# Patient Record
Sex: Male | Born: 2005 | Race: White | Hispanic: No | Marital: Single | Smoking: Never smoker
Health system: Southern US, Community
[De-identification: ages and names within clinical notes are randomized; demographics above are authoritative.]

## PROBLEM LIST (undated history)

## (undated) DIAGNOSIS — D169 Benign neoplasm of bone and articular cartilage, unspecified: Secondary | ICD-10-CM

## (undated) HISTORY — DX: Benign neoplasm of bone and articular cartilage, unspecified: D16.9

## (undated) HISTORY — PX: OTHER SURGICAL HISTORY: SHX169

---
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
## 2024-01-04 ENCOUNTER — Ambulatory Visit: Admitting: Family Medicine

## 2024-01-04 ENCOUNTER — Encounter: Payer: Self-pay | Admitting: Family Medicine

## 2024-01-04 VITALS — BP 110/75 | HR 90 | Temp 98.1°F | Ht 75.0 in | Wt 201.0 lb

## 2024-01-04 DIAGNOSIS — F900 Attention-deficit hyperactivity disorder, predominantly inattentive type: Secondary | ICD-10-CM | POA: Diagnosis not present

## 2024-01-04 MED ORDER — AMPHETAMINE-DEXTROAMPHET ER 15 MG PO CP24
15.0000 mg | ORAL_CAPSULE | ORAL | 0 refills | Status: DC
Start: 2024-03-03 — End: 2024-04-15

## 2024-01-04 MED ORDER — AMPHETAMINE-DEXTROAMPHET ER 15 MG PO CP24: 15.0000 mg | ORAL_CAPSULE | ORAL | 0 refills | Status: DC

## 2024-01-04 NOTE — Progress Notes (Signed)
 BP 110/75   Pulse 90   Temp 98.1 F (36.7 C)   Ht 6' 3 (1.905 m)   Wt 201 lb (91.2 kg)   SpO2 96%   BMI 25.12 kg/m    Subjective:   Patient ID: Brian Cochran, male    DOB: 2006-05-20, 18 y.o.   MRN: [id number]  HPI: Brian Cochran is a 18 y.o. male presenting on 01/04/2024 for Medical Management of Chronic Issues (Patient states that he thinks he is on the right medicine. He believes 20mg  is better than the 10mg . Patient states he is sleeping better but would like to talk about the medication y'all talked about last time that may help improve sleep. )   HPI ADHD recheck Current rx-Adderall 10 mg daily, will increase to 15 mg # meds rx-30/month Effectiveness of current meds-works okay, the 20s worked better for cognition but had trouble with sleep and appetite but the tendons do not work completely well. Adverse reactions form meds-none on the lower dose Pill count performed-No Last drug screen -N/A ( high risk q66m, moderate risk q51m, low risk yearly ) Urine drug screen today- No Was the NCCSR reviewed-yes  If yes were their any concerning findings? -None Controlled substance contract signed on:    Relevant past medical, surgical, family and social history reviewed and updated as indicated. Interim medical history since our last visit reviewed. Allergies and medications reviewed and updated.  Review of Systems  Constitutional:  Negative for chills and fever.  Eyes:  Negative for visual disturbance.  Respiratory:  Negative for shortness of breath and wheezing.   Cardiovascular:  Negative for chest pain and leg swelling.  Skin:  Negative for rash.  Psychiatric/Behavioral:  Positive for decreased concentration.   All other systems reviewed and are negative.   Per HPI unless specifically indicated above   Allergies as of 01/04/2024   No Known Allergies      Medication List        Accurate as of January 04, 2024  2:50 PM. If you have any questions, ask your nurse or  doctor.          STOP taking these medications    amphetamine -dextroamphetamine 10 MG 24 hr capsule Commonly known as: Adderall XR Replaced by: amphetamine -dextroamphetamine 15 MG 24 hr capsule Stopped by: Lucio Sabin Annalaya Wile       TAKE these medications    amphetamine -dextroamphetamine 15 MG 24 hr capsule Commonly known as: Adderall XR Take 1 capsule by mouth every morning. Start taking on: February 02, 2024 Replaces: amphetamine -dextroamphetamine 10 MG 24 hr capsule Started by: Lucio Sabin Ellamae Lybeck   amphetamine -dextroamphetamine 15 MG 24 hr capsule Commonly known as: Adderall XR Take 1 capsule by mouth every morning. Start taking on: March 03, 2024 Started by: Melodie Spry A Tilak Oakley   famotidine  20 MG tablet Commonly known as: PEPCID  TAKE 1 TABLET (20 MG TOTAL) BY MOUTH 2 (TWO) TIMES DAILY. DX. K21.9         Objective:   BP 110/75   Pulse 90   Temp 98.1 F (36.7 C)   Ht 6' 3 (1.905 m)   Wt 201 lb (91.2 kg)   SpO2 96%   BMI 25.12 kg/m   Wt Readings from Last 3 Encounters:  01/04/24 201 lb (91.2 kg) (93%, Z= 1.48)*  12/09/23 206 lb (93.4 kg) (95%, Z= 1.60)*  11/11/23 207 lb (93.9 kg) (95%, Z= 1.63)*   * Growth percentiles are based on CDC (Boys, 2-20 Years) data.  Physical Exam Vitals and nursing note reviewed.  Constitutional:      General: He is not in acute distress.    Appearance: He is well-developed. He is not diaphoretic.   Eyes:     General: No scleral icterus.    Conjunctiva/sclera: Conjunctivae normal.   Neck:     Thyroid: No thyromegaly.   Cardiovascular:     Rate and Rhythm: Normal rate and regular rhythm.     Heart sounds: Normal heart sounds. No murmur heard. Pulmonary:     Effort: Pulmonary effort is normal. No respiratory distress.     Breath sounds: Normal breath sounds. No wheezing.   Musculoskeletal:        General: Normal range of motion.     Cervical back: Neck supple.  Lymphadenopathy:     Cervical: No cervical  adenopathy.   Skin:    General: Skin is warm and dry.     Findings: No rash.   Neurological:     Mental Status: He is alert and oriented to person, place, and time.     Coordination: Coordination normal.   Psychiatric:        Attention and Perception: He is inattentive.        Behavior: Behavior normal.       Assessment & Plan:   Problem List Items Addressed This Visit       Other   Attention deficit hyperactivity disorder (ADHD), predominantly inattentive type - Primary   Relevant Medications   amphetamine -dextroamphetamine (ADDERALL XR) 15 MG 24 hr capsule (Start on 02/02/2024)   amphetamine -dextroamphetamine (ADDERALL XR) 15 MG 24 hr capsule (Start on 03/03/2024)    Increase to 15 mg daily and see if it does better without as many side effects. Follow up plan: Return in about 3 months (around 04/05/2024), or if symptoms worsen or fail to improve, for ADHD recheck.  Counseling provided for all of the vaccine components No orders of the defined types were placed in this encounter.   Jolyne Needs, MD [facility] 01/04/2024, 2:50 PM

## 2024-02-12 ENCOUNTER — Other Ambulatory Visit: Payer: Self-pay | Admitting: Family Medicine

## 2024-02-12 DIAGNOSIS — K219 Gastro-esophageal reflux disease without esophagitis: Secondary | ICD-10-CM

## 2024-04-07 ENCOUNTER — Encounter: Payer: Self-pay | Admitting: Family Medicine

## 2024-04-07 ENCOUNTER — Ambulatory Visit: Admitting: Family Medicine

## 2024-04-15 ENCOUNTER — Ambulatory Visit: Admitting: Family Medicine

## 2024-04-15 ENCOUNTER — Encounter: Payer: Self-pay | Admitting: Family Medicine

## 2024-04-15 VITALS — BP 127/78 | HR 86 | Ht 75.0 in

## 2024-04-15 DIAGNOSIS — F900 Attention-deficit hyperactivity disorder, predominantly inattentive type: Secondary | ICD-10-CM

## 2024-04-15 MED ORDER — AMPHETAMINE-DEXTROAMPHET ER 15 MG PO CP24: 15.0000 mg | ORAL_CAPSULE | ORAL | 0 refills | Status: DC

## 2024-04-15 NOTE — Progress Notes (Signed)
 BP 127/78   Pulse 86   Ht 6' 3 (1.905 m)   SpO2 98%   BMI 25.12 kg/m    Subjective:   Patient ID: Brian Cochran, male    DOB: Oct 11, 2005, 18 y.o.   MRN: [id number]  HPI: Brian Cochran is a 18 y.o. male presenting on 04/15/2024 for Medical Management of Chronic Issues and ADHD   Discussed the use of AI scribe software for clinical note transcription with the patient, who gave verbal consent to proceed.  History of Present Illness   Brian Cochran is a 18 year old male with ADHD who presents for a recheck.  Attention deficit hyperactivity disorder symptoms and stimulant use - Currently taking Adderall extended release 15 mg daily for the past few months - Adderall has been effective in improving focus on tasks and assignments - Able to take medication breaks without difficulty - No headaches or mood swings while on medication - Medication refills have been consistent with no early refills per [place]  Drug Database          Relevant past medical, surgical, family and social history reviewed and updated as indicated. Interim medical history since our last visit reviewed. Allergies and medications reviewed and updated.  Review of Systems  Constitutional:  Negative for chills and fever.  Eyes:  Negative for visual disturbance.  Respiratory:  Negative for shortness of breath and wheezing.   Cardiovascular:  Negative for chest pain and leg swelling.  Musculoskeletal:  Negative for back pain and gait problem.  Skin:  Negative for rash.  Neurological:  Negative for dizziness and headaches.  Psychiatric/Behavioral:  Negative for decreased concentration and dysphoric mood. The patient is not nervous/anxious.   All other systems reviewed and are negative.   Per HPI unless specifically indicated above   Allergies as of 04/15/2024   No Known Allergies      Medication List        Accurate as of April 15, 2024  9:33 AM. If you have any questions, ask your nurse  or doctor.          amphetamine -dextroamphetamine 15 MG 24 hr capsule Commonly known as: Adderall XR Take 1 capsule by mouth every morning. What changed:  Another medication with the same name was added. Make sure you understand how and when to take each. Another medication with the same name was changed. Make sure you understand how and when to take each. Changed by: Fonda LABOR Shabre Kreher   amphetamine -dextroamphetamine 15 MG 24 hr capsule Commonly known as: Adderall XR Take 1 capsule by mouth every morning. Start taking on: May 14, 2024 What changed: You were already taking a medication with the same name, and this prescription was added. Make sure you understand how and when to take each. Changed by: Fonda LABOR Markiah Janeway   amphetamine -dextroamphetamine 15 MG 24 hr capsule Commonly known as: Adderall XR Take 1 capsule by mouth every morning. Start taking on: June 14, 2024 What changed: These instructions start on June 14, 2024. If you are unsure what to do until then, ask your doctor or other care provider. Changed by: Fonda LABOR Maleigha Colvard   famotidine  20 MG tablet Commonly known as: PEPCID  TAKE 1 TABLET (20 MG TOTAL) BY MOUTH 2 (TWO) TIMES DAILY         Objective:   BP 127/78   Pulse 86   Ht 6' 3 (1.905 m)   SpO2 98%   BMI 25.12 kg/m   Wt Readings from Last 3  Encounters:  01/04/24 201 lb (91.2 kg) (93%, Z= 1.48)*  12/09/23 206 lb (93.4 kg) (95%, Z= 1.60)*  11/11/23 207 lb (93.9 kg) (95%, Z= 1.63)*   * Growth percentiles are based on CDC (Boys, 2-20 Years) data.    Physical Exam Vitals and nursing note reviewed.  Constitutional:      Appearance: Normal appearance.  Neurological:     Mental Status: He is alert.  Psychiatric:        Attention and Perception: He is attentive.        Mood and Affect: Mood is not anxious or depressed.        Behavior: Behavior is not hyperactive.    Physical Exam   CHEST: Lungs clear to auscultation  bilaterally. CARDIOVASCULAR: Heart regular rate and rhythm, no murmurs.         Assessment & Plan:   Problem List Items Addressed This Visit       Other   Attention deficit hyperactivity disorder (ADHD), predominantly inattentive type - Primary   Relevant Medications   amphetamine -dextroamphetamine (ADDERALL XR) 15 MG 24 hr capsule (Start on 06/14/2024)   amphetamine -dextroamphetamine (ADDERALL XR) 15 MG 24 hr capsule   amphetamine -dextroamphetamine (ADDERALL XR) 15 MG 24 hr capsule (Start on 05/14/2024)   Other Relevant Orders   ToxASSURE Select 13 (MW), Urine       Attention-deficit hyperactivity disorder, predominantly inattentive type ADHD well-controlled on Adderall XR 15 mg daily. No adverse effects reported. - Continue Adderall XR 15 mg daily. - Perform urine drug screen today. - Sign controlled substance agreement today. - Send prescription refills to [facility] for three months. - Schedule follow-up in three months.          Follow up plan: Return in about 3 months (around 07/15/2024), or if symptoms worsen or fail to improve, for ADHD.  Counseling provided for all of the vaccine components Orders Placed This Encounter  Procedures   ToxASSURE Select 13 (MW), Urine    Fonda Levins, MD [facility] 04/15/2024, 9:33 AM

## 2024-04-19 LAB — TOXASSURE SELECT 13 (MW), URINE

## 2024-04-21 ENCOUNTER — Ambulatory Visit: Payer: Self-pay | Admitting: Family Medicine

## 2024-05-05 ENCOUNTER — Ambulatory Visit: Admitting: Family Medicine

## 2024-05-10 ENCOUNTER — Other Ambulatory Visit: Payer: Self-pay | Admitting: Family Medicine

## 2024-05-10 DIAGNOSIS — K219 Gastro-esophageal reflux disease without esophagitis: Secondary | ICD-10-CM

## 2024-05-24 ENCOUNTER — Telehealth: Payer: Self-pay

## 2024-05-24 DIAGNOSIS — E785 Hyperlipidemia, unspecified: Secondary | ICD-10-CM

## 2024-05-24 NOTE — Telephone Encounter (Unsigned)
 Copied from CRM [id number]. Topic: Clinical - Request for Lab/Test Order >> May 24, 2024  1:06 PM Sophia H wrote: Reason for CRM: Patient is requesting labs for cholesterol and triglycerides - states for insurance purposes. Please advise # [phone]

## 2024-05-25 NOTE — Telephone Encounter (Signed)
 Dr. Maryanne, are you alright to order labs. Pt last seen in September and has a follow up in Jan.

## 2024-05-25 NOTE — Telephone Encounter (Signed)
 Future lab orders placed. Lab appt scheduled for 05/30/24 at 9:45am. Pt aware.

## 2024-05-25 NOTE — Telephone Encounter (Signed)
 Yes go ahead and order labs for him to come in and do his CBC a chemistry panel and a lipid panel, diagnosis dyslipidemia.  Get him a lab appointment to come in and do that.

## 2024-05-30 ENCOUNTER — Other Ambulatory Visit

## 2024-05-30 DIAGNOSIS — E785 Hyperlipidemia, unspecified: Secondary | ICD-10-CM

## 2024-05-30 LAB — CMP14+EGFR
ALT: 55 IU/L — ABNORMAL HIGH (ref 0–44)
AST: 25 IU/L (ref 0–40)
Albumin: 4.7 g/dL (ref 4.3–5.2)
Alkaline Phosphatase: 99 IU/L (ref 51–125)
BUN/Creatinine Ratio: 9 (ref 9–20)
BUN: 10 mg/dL (ref 6–20)
Bilirubin Total: 0.8 mg/dL (ref 0.0–1.2)
CO2: 25 mmol/L (ref 20–29)
Calcium: 9.9 mg/dL (ref 8.7–10.2)
Chloride: 100 mmol/L (ref 96–106)
Creatinine, Ser: 1.14 mg/dL (ref 0.76–1.27)
Globulin, Total: 2.1 g/dL (ref 1.5–4.5)
Glucose: 83 mg/dL (ref 70–99)
Potassium: 3.8 mmol/L (ref 3.5–5.2)
Sodium: 141 mmol/L (ref 134–144)
Total Protein: 6.8 g/dL (ref 6.0–8.5)
eGFR: 95 mL/min/1.73 (ref >59)

## 2024-05-30 LAB — LIPID PANEL
Chol/HDL Ratio: 3 ratio (ref 0.0–5.0)
Cholesterol, Total: 182 mg/dL — ABNORMAL HIGH (ref 100–169)
HDL: 60 mg/dL (ref >39)
LDL Chol Calc (NIH): 109 mg/dL (ref 0–109)
Triglycerides: 72 mg/dL (ref 0–89)
VLDL Cholesterol Cal: 13 mg/dL (ref 5–40)

## 2024-05-30 LAB — CBC WITH DIFFERENTIAL/PLATELET
Basophils Absolute: 0 x10E3/uL (ref 0.0–0.2)
Basos: 1 %
EOS (ABSOLUTE): 0.3 x10E3/uL (ref 0.0–0.4)
Eos: 5 %
Hematocrit: 50.9 % (ref 37.5–51.0)
Hemoglobin: 16.8 g/dL (ref 13.0–17.7)
Immature Grans (Abs): 0 x10E3/uL (ref 0.0–0.1)
Immature Granulocytes: 0 %
Lymphocytes Absolute: 2.3 x10E3/uL (ref 0.7–3.1)
Lymphs: 40 %
MCH: 29.9 pg (ref 26.6–33.0)
MCHC: 33 g/dL (ref 31.5–35.7)
MCV: 91 fL (ref 79–97)
Monocytes Absolute: 0.4 x10E3/uL (ref 0.1–0.9)
Monocytes: 8 %
Neutrophils Absolute: 2.7 x10E3/uL (ref 1.4–7.0)
Neutrophils: 46 %
Platelets: 273 x10E3/uL (ref 150–450)
RBC: 5.62 x10E6/uL (ref 4.14–5.80)
RDW: 12.7 % (ref 11.6–15.4)
WBC: 5.8 x10E3/uL (ref 3.4–10.8)

## 2024-06-08 ENCOUNTER — Ambulatory Visit: Payer: Self-pay | Admitting: Family Medicine

## 2024-06-08 DIAGNOSIS — Z8659 Personal history of other mental and behavioral disorders: Secondary | ICD-10-CM

## 2024-06-08 DIAGNOSIS — R748 Abnormal levels of other serum enzymes: Secondary | ICD-10-CM

## 2024-06-08 DIAGNOSIS — F419 Anxiety disorder, unspecified: Secondary | ICD-10-CM

## 2024-06-22 ENCOUNTER — Other Ambulatory Visit

## 2024-06-22 DIAGNOSIS — Z8659 Personal history of other mental and behavioral disorders: Secondary | ICD-10-CM

## 2024-06-22 DIAGNOSIS — R748 Abnormal levels of other serum enzymes: Secondary | ICD-10-CM

## 2024-06-22 DIAGNOSIS — F419 Anxiety disorder, unspecified: Secondary | ICD-10-CM

## 2024-06-23 LAB — CMP14+EGFR
ALT: 75 IU/L — ABNORMAL HIGH (ref 0–44)
AST: 28 IU/L (ref 0–40)
Albumin: 5 g/dL (ref 4.3–5.2)
Alkaline Phosphatase: 107 IU/L (ref 51–125)
BUN/Creatinine Ratio: 11 (ref 9–20)
BUN: 13 mg/dL (ref 6–20)
Bilirubin Total: 0.8 mg/dL (ref 0.0–1.2)
CO2: 27 mmol/L (ref 20–29)
Calcium: 10.4 mg/dL — ABNORMAL HIGH (ref 8.7–10.2)
Chloride: 98 mmol/L (ref 96–106)
Creatinine, Ser: 1.15 mg/dL (ref 0.76–1.27)
Globulin, Total: 2.1 g/dL (ref 1.5–4.5)
Glucose: 88 mg/dL (ref 70–99)
Potassium: 4.1 mmol/L (ref 3.5–5.2)
Sodium: 140 mmol/L (ref 134–144)
Total Protein: 7.1 g/dL (ref 6.0–8.5)
eGFR: 94 mL/min/1.73 (ref >59)

## 2024-06-23 LAB — B12 AND FOLATE PANEL
Folate: 7.1 ng/mL (ref >3.0)
Vitamin B-12: 634 pg/mL (ref 232–1245)

## 2024-06-23 LAB — VITAMIN D 25 HYDROXY (VIT D DEFICIENCY, FRACTURES): Vit D, 25-Hydroxy: 49.4 ng/mL (ref 30.0–100.0)

## 2024-06-27 ENCOUNTER — Ambulatory Visit: Payer: Self-pay | Admitting: Family Medicine

## 2024-07-25 ENCOUNTER — Ambulatory Visit: Payer: Self-pay | Admitting: Family Medicine

## 2024-07-25 VITALS — BP 107/72 | HR 80 | Ht 75.0 in | Wt 185.0 lb

## 2024-07-25 DIAGNOSIS — F900 Attention-deficit hyperactivity disorder, predominantly inattentive type: Secondary | ICD-10-CM

## 2024-07-25 DIAGNOSIS — R7989 Other specified abnormal findings of blood chemistry: Secondary | ICD-10-CM

## 2024-07-25 MED ORDER — AMPHETAMINE-DEXTROAMPHET ER 15 MG PO CP24: 15.0000 mg | ORAL_CAPSULE | ORAL | 0 refills | Status: AC

## 2024-07-25 NOTE — Progress Notes (Signed)
 "  BP 107/72   Pulse 80   Ht 6' 3 (1.905 m)   Wt 185 lb (83.9 kg)   SpO2 100%   BMI 23.12 kg/m    Subjective:   Patient ID: Brian Cochran, male    DOB: 2005/09/18, 19 y.o.   MRN: [id number]  HPI: Brian Cochran is a 19 y.o. male presenting on 07/25/2024 for Medical Management of Chronic Issues and ADHD   Discussed the use of AI scribe software for clinical note transcription with the patient, who gave verbal consent to proceed.  History of Present Illness   Brian Cochran is a 19 year old male with ADHD who presents for a recheck of his ADHD medication.  Attention deficit hyperactivity disorder (adhd) symptom management - ADHD medication is effective in improving focus and task completion. 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 - Currently working on developing an physicist, medical work on [employer].  Abnormal liver enzymes - Previous blood tests demonstrated elevated liver enzymes.   Denies any abdominal complaints or bowel issues. Denies alcohol intake      Relevant past medical, surgical, family and social history reviewed and updated as indicated. Interim medical history since our last visit reviewed. Allergies and medications reviewed and updated.  Review of Systems  Constitutional:  Negative for chills and fever.  Respiratory:  Negative for shortness of breath and wheezing.   Cardiovascular:  Negative for chest pain and leg swelling.  Gastrointestinal:  Negative for abdominal distention, abdominal pain, constipation, diarrhea, nausea and vomiting.  Musculoskeletal:  Negative for back pain and gait problem.  Skin:  Negative for rash.  All other systems reviewed and are negative.   Per HPI unless specifically indicated above   Allergies as of 07/25/2024   No Known Allergies      Medication List        Accurate as  of July 25, 2024 11:21 AM. If you have any questions, ask your nurse or doctor.          amphetamine -dextroamphetamine 15 MG 24 hr capsule Commonly known as: Adderall XR Take 1 capsule by mouth every morning. What changed: Another medication with the same name was changed. Make sure you understand how and when to take each. Changed by: Fonda Levins, MD   amphetamine -dextroamphetamine 15 MG 24 hr capsule Commonly known as: Adderall XR Take 1 capsule by mouth every morning. Start taking on: August 24, 2024 What changed: These instructions start on August 24, 2024. If you are unsure what to do until then, ask your doctor or other care provider. Changed by: Fonda Levins, MD   amphetamine -dextroamphetamine 15 MG 24 hr capsule Commonly known as: Adderall XR Take 1 capsule by mouth every morning. Start taking on: September 21, 2024 What changed: These instructions start on September 21, 2024. If you are unsure what to do until then, ask your doctor or other care provider. Changed by: Fonda Levins, MD   famotidine  20 MG tablet Commonly known as: PEPCID  TAKE 1 TABLET (20 MG TOTAL) BY MOUTH 2 (TWO) TIMES DAILY         Objective:   BP 107/72   Pulse 80   Ht 6' 3 (1.905 m)   Wt 185 lb (83.9 kg)   SpO2 100%   BMI 23.12 kg/m   Wt Readings from Last 3 Encounters:  07/25/24 185 lb (83.9 kg) (  84%, Z= 1.01)*  01/04/24 201 lb (91.2 kg) (93%, Z= 1.48)*  12/09/23 206 lb (93.4 kg) (95%, Z= 1.60)*   * Growth percentiles are based on CDC (Boys, 2-20 Years) data.    Physical Exam Physical Exam   CHEST: Lungs clear to auscultation. CARDIOVASCULAR: Heart regular rate and rhythm, no murmurs. ABDOMEN: Abdomen non-tender, no abnormalities. No hepatomegaly.         Assessment & Plan:   Problem List Items Addressed This Visit       Other   Attention deficit hyperactivity disorder (ADHD), predominantly inattentive type - Primary   Relevant Medications    amphetamine -dextroamphetamine (ADDERALL XR) 15 MG 24 hr capsule (Start on 08/24/2024)   amphetamine -dextroamphetamine (ADDERALL XR) 15 MG 24 hr capsule (Start on 09/21/2024)   amphetamine -dextroamphetamine (ADDERALL XR) 15 MG 24 hr capsule   Other Visit Diagnoses       Elevated liver function tests       Relevant Orders   US  ABDOMEN RUQ W/ELASTOGRAPHY   CMP14+EGFR   HIV Antibody (routine testing w rflx)   HepB+HepC+HIV Panel          Attention-deficit hyperactivity disorder, predominantly inattentive type ADHD symptoms well-managed with current medication. Reports improved focus and productivity. - Sent refills for Adderall.  Elevated liver function tests Liver function tests elevated, not critical. Further investigation needed due to young age. - Ordered blood work to recheck liver function tests. - Ordered liver ultrasound to assess for underlying issues. - Will consider referral to a specialist if liver function tests remain elevated and ultrasound findings are inconclusive.          Follow up plan: Return in about 3 months (around 10/23/2024), or if symptoms worsen or fail to improve, for ADHD recheck.  Counseling provided for all of the vaccine components Orders Placed This Encounter  Procedures   US  ABDOMEN RUQ W/ELASTOGRAPHY   CMP14+EGFR   HIV Antibody (routine testing w rflx)   HepB+HepC+HIV Panel    Fonda Levins, MD [facility] 07/25/2024, 11:21 AM     "

## 2024-07-26 LAB — HEPB+HEPC+HIV PANEL
HIV Screen 4th Generation wRfx: NONREACTIVE
Hep B C IgM: NEGATIVE
Hep B Core Total Ab: NEGATIVE
Hep B E Ab: NONREACTIVE
Hep B E Ag: NEGATIVE
Hep B Surface Ab, Qual: NONREACTIVE
Hep C Virus Ab: NONREACTIVE
Hepatitis B Surface Ag: NEGATIVE

## 2024-07-26 LAB — CMP14+EGFR
ALT: 37 IU/L (ref 0–44)
AST: 31 IU/L (ref 0–40)
Albumin: 4.9 g/dL (ref 4.3–5.2)
Alkaline Phosphatase: 93 IU/L (ref 51–125)
BUN/Creatinine Ratio: 11 (ref 9–20)
BUN: 11 mg/dL (ref 6–20)
Bilirubin Total: 0.7 mg/dL (ref 0.0–1.2)
CO2: 23 mmol/L (ref 20–29)
Calcium: 10.1 mg/dL (ref 8.7–10.2)
Chloride: 100 mmol/L (ref 96–106)
Creatinine, Ser: 1.04 mg/dL (ref 0.76–1.27)
Globulin, Total: 2 g/dL (ref 1.5–4.5)
Glucose: 89 mg/dL (ref 70–99)
Potassium: 4.1 mmol/L (ref 3.5–5.2)
Sodium: 141 mmol/L (ref 134–144)
Total Protein: 6.9 g/dL (ref 6.0–8.5)
eGFR: 106 mL/min/1.73

## 2024-07-29 ENCOUNTER — Ambulatory Visit: Payer: Self-pay | Admitting: Family Medicine

## 2024-07-29 ENCOUNTER — Ambulatory Visit (HOSPITAL_COMMUNITY)
Admission: RE | Admit: 2024-07-29 | Discharge: 2024-07-29 | Disposition: A | Source: Ambulatory Visit | Attending: Family Medicine

## 2024-07-29 DIAGNOSIS — R7989 Other specified abnormal findings of blood chemistry: Secondary | ICD-10-CM | POA: Diagnosis present

## 2024-08-11 ENCOUNTER — Other Ambulatory Visit: Payer: Self-pay | Admitting: Family Medicine

## 2024-08-11 DIAGNOSIS — K219 Gastro-esophageal reflux disease without esophagitis: Secondary | ICD-10-CM
